# Patient Record
Sex: Male | Born: 1995 | Race: Black or African American | Hispanic: No | Marital: Single | Smoking: Former smoker
Health system: Southern US, Community
[De-identification: ages and names within clinical notes are randomized; demographics above are authoritative.]

## PROBLEM LIST (undated history)

## (undated) DIAGNOSIS — F909 Attention-deficit hyperactivity disorder, unspecified type: Principal | ICD-10-CM

## (undated) HISTORY — DX: Attention-deficit hyperactivity disorder, unspecified type: F90.9

---
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
## 2013-01-28 ENCOUNTER — Ambulatory Visit: Payer: Managed Care, Other (non HMO) | Admitting: Pediatrics

## 2014-05-25 ENCOUNTER — Other Ambulatory Visit (HOSPITAL_COMMUNITY): Payer: Self-pay | Admitting: Physician Assistant

## 2014-05-25 DIAGNOSIS — R4702 Dysphasia: Secondary | ICD-10-CM

## 2014-05-25 DIAGNOSIS — R202 Paresthesia of skin: Secondary | ICD-10-CM

## 2014-05-31 ENCOUNTER — Ambulatory Visit (HOSPITAL_COMMUNITY)
Admission: RE | Admit: 2014-05-31 | Discharge: 2014-05-31 | Disposition: A | Discharge status: Home / Self Care | Payer: Managed Care, Other (non HMO) | Source: Ambulatory Visit | Attending: Physician Assistant | Admitting: Physician Assistant

## 2014-05-31 ENCOUNTER — Other Ambulatory Visit (HOSPITAL_COMMUNITY): Payer: Managed Care, Other (non HMO)

## 2014-05-31 DIAGNOSIS — R4702 Dysphasia: Secondary | ICD-10-CM | POA: Insufficient documentation

## 2014-05-31 DIAGNOSIS — R202 Paresthesia of skin: Secondary | ICD-10-CM | POA: Insufficient documentation

## 2014-07-03 ENCOUNTER — Ambulatory Visit: Payer: Managed Care, Other (non HMO) | Admitting: Neurology

## 2014-07-05 ENCOUNTER — Ambulatory Visit (INDEPENDENT_AMBULATORY_CARE_PROVIDER_SITE_OTHER): Payer: Managed Care, Other (non HMO) | Admitting: Neurology

## 2014-07-05 ENCOUNTER — Encounter: Payer: Self-pay | Admitting: Neurology

## 2014-07-05 VITALS — BP 110/80 | HR 81 | Resp 16 | Ht 70.0 in | Wt 132.0 lb

## 2014-07-05 DIAGNOSIS — R202 Paresthesia of skin: Secondary | ICD-10-CM

## 2014-07-05 DIAGNOSIS — G0489 Other myelitis: Secondary | ICD-10-CM

## 2014-07-05 DIAGNOSIS — G373 Acute transverse myelitis in demyelinating disease of central nervous system: Secondary | ICD-10-CM

## 2014-07-05 LAB — CBC
HCT: 47.6 % (ref 39.0–52.0)
Hemoglobin: 15.7 g/dL (ref 13.0–17.0)
MCH: 29.2 pg (ref 26.0–34.0)
MCHC: 33 g/dL (ref 30.0–36.0)
MCV: 88.5 fL (ref 78.0–100.0)
MPV: 9.9 fL (ref 8.6–12.4)
Platelets: 298 10*3/uL (ref 150–400)
RBC: 5.38 MIL/uL (ref 4.22–5.81)
RDW: 14 % (ref 11.5–15.5)
WBC: 4.8 10*3/uL (ref 4.0–10.5)

## 2014-07-05 LAB — COMPREHENSIVE METABOLIC PANEL
ALBUMIN: 4.4 g/dL (ref 3.5–5.2)
ALT: 14 U/L (ref 0–53)
AST: 15 U/L (ref 0–37)
Alkaline Phosphatase: 65 U/L (ref 39–117)
BUN: 13 mg/dL (ref 6–23)
CO2: 28 mEq/L (ref 19–32)
Calcium: 9.7 mg/dL (ref 8.4–10.5)
Chloride: 103 mEq/L (ref 96–112)
Creat: 0.93 mg/dL (ref 0.50–1.35)
Glucose, Bld: 64 mg/dL — ABNORMAL LOW (ref 70–99)
Potassium: 4.2 mEq/L (ref 3.5–5.3)
Sodium: 139 mEq/L (ref 135–145)
Total Bilirubin: 0.4 mg/dL (ref 0.2–1.2)
Total Protein: 7.2 g/dL (ref 6.0–8.3)

## 2014-07-05 LAB — TSH: TSH: 1.911 u[IU]/mL (ref 0.350–4.500)

## 2014-07-05 LAB — VITAMIN B12: VITAMIN B 12: 799 pg/mL (ref 211–911)

## 2014-07-05 MED ORDER — NORTRIPTYLINE HCL 10 MG PO CAPS: ORAL_CAPSULE | ORAL | Status: AC

## 2014-07-05 NOTE — Patient Instructions (Addendum)
1. Bloodwork for CBC, CMP, ESR, ANA, SS-A, SS-B,TSH, vitamin B12, SPEP/IFE, HIV, RPR 2. MRI cervical spine with and without contrast 3. MRI thoracic spine with and without contrast 4. EMG/NCV of both UE 5. Start nortriptyline 10mg : Take 1 cap at bedtime for 1 week, then increase to 2 caps at bedtime. Monitor for drowsiness 6. Follow-up in 1 month   PLEASE CALL [facility] TO [place] @ [phone].

## 2014-07-05 NOTE — Progress Notes (Signed)
NEUROLOGY CONSULTATION NOTE  Danny Walter MRN: [id number] DOB: 1996-03-09  Referring provider: Lenise HeraldBenjamin Mann, PA-C Primary care provider: Lenise HeraldBenjamin Mann, PA-C  Reason for consult:  Tingling from waist up x 3 years  Thank you for your kind referral of Danny Walter for consultation of the above symptoms. Although his history is well known to you, please allow me to reiterate it for the purpose of our medical record. Records and images were personally reviewed where available.  HISTORY OF PRESENT ILLNESS: This is a 19 year old right-handed man with a history of ADHD presenting with a 3-year history of paresthesias in his upper body. Symptoms were initially not as painful, however over the past few months, he has been significantly bothered by pins and needles and burning pain from the waist up to his arms. It is mostly painful in the chest area, but he also notices symptoms on his back and on the top of his head, including his face. He reports that symptoms last for 5-10 minutes, often affecting his whole upper body, occurring several times a day. He has noticed that heat and lifting things would trigger the symptoms, making it hard to do anything physical. Putting on a cool towel seems to make it better. He can wake up in the middle of the night and have the symptoms. He denies any rash, but has noticed redness after he continuously rubs the affected areas. Over the past 1-2 months, he has also noticed similar symptoms over both thighs, sometimes he has slight spasms in both legs. Interestingly, alcohol (hard liquor) reduces the symptoms for a few hours, that he would take one shot of liquor daily to help.  He has mild headaches around once a week. He denies any nausea, vomiting, photo/phonophobia. He denies any dizziness, diplopia, blurred vision, dysarthria, dysphagia, focal weakness. He has occasional neck and low back pain. In the past 3-4 months, he has had an electrical shock sensation in the  back of his neck occurring around once a month, these quieted down in the past 1-2 months. He denies any bowel/bladder dysfunction. He denies any family history of similar symptoms, no falls or head injuries. He previously did heavy lifting at a prior job 6 months ago. He was prescribed low dose gabapentin which he took for a few weeks with no effect. He had seen an allergist and had taken hydroxyzine and used a steroid cream, with no effect.   I personally reviewed MRI brain without contrast which was unremarkable.  PAST MEDICAL HISTORY: Past Medical History  Diagnosis Date  . ADHD (attention deficit hyperactivity disorder) 08/26/2012    PAST SURGICAL HISTORY: No past surgical history on file.  MEDICATIONS: No current outpatient prescriptions on file prior to visit.   No current facility-administered medications on file prior to visit.    ALLERGIES: No Known Allergies  FAMILY HISTORY: Family History  Problem Relation Age of Onset  . Hyperlipidemia Father 45    died of cardiac event  . Hyperlipidemia Paternal Grandfather   . Alcohol abuse Mother     SOCIAL HISTORY: History   Social History  . Marital Status: Single    Spouse Name: N/A  . Number of Children: N/A  . Years of Education: N/A   Occupational History  . Not on file.   Social History Main Topics  . Smoking status: Never Smoker   . Smokeless tobacco: Not on file  . Alcohol Use: 0.0 oz/week    0 Standard drinks or equivalent per week  Comment: 1 shot daily  . Drug Use: No  . Sexual Activity: Not on file   Other Topics Concern  . Not on file   Social History Narrative    REVIEW OF SYSTEMS: Constitutional: No fevers, chills, or sweats, no generalized fatigue, change in appetite Eyes: No visual changes, double vision, eye pain Ear, nose and throat: No hearing loss, ear pain, nasal congestion, sore throat Cardiovascular: No chest pain, palpitations Respiratory:  No shortness of breath at rest or  with exertion, wheezes GastrointestinaI: No nausea, vomiting, diarrhea, abdominal pain, fecal incontinence Genitourinary:  No dysuria, urinary retention or frequency Musculoskeletal:  No neck pain, back pain Integumentary: No rash, pruritus, skin lesions Neurological: as above Psychiatric: No depression, insomnia, anxiety Endocrine: No palpitations, fatigue, diaphoresis, mood swings, change in appetite, change in weight, increased thirst Hematologic/Lymphatic:  No anemia, purpura, petechiae. Allergic/Immunologic: no itchy/runny eyes, nasal congestion, recent allergic reactions, rashes  PHYSICAL EXAM: Filed Vitals:   07/05/14 1313  BP: 110/80  Pulse: 81  Resp: 16   General: No acute distress Head:  Normocephalic/atraumatic Eyes: Fundoscopic exam shows bilateral sharp discs, no vessel changes, exudates, or hemorrhages Neck: supple, no paraspinal tenderness, full range of motion Back: No paraspinal tenderness Heart: regular rate and rhythm Lungs: Clear to auscultation bilaterally. Vascular: No carotid bruits. Skin/Extremities: No rash, no edema Neurological Exam: Mental status: alert and oriented to person, place, and time, no dysarthria or aphasia, Fund of knowledge is appropriate.  Recent and remote memory are intact.  Attention and concentration are normal.    Able to name objects and repeat phrases. Cranial nerves: CN I: not tested CN II: pupils equal, round and reactive to light, visual fields intact, fundi unremarkable. CN III, IV, VI:  full range of motion, no nystagmus, no ptosis CN V: facial sensation intact CN VII: upper and lower face symmetric CN VIII: hearing intact to finger rub CN IX, X: gag intact, uvula midline CN XI: sternocleidomastoid and trapezius muscles intact CN XII: tongue midline Bulk & Tone: normal, no fasciculations. Motor: 5/5 throughout with no pronator drift. Sensation: intact to light touch, cold, pin, vibration and joint position sense on both UE  and LE. Over the chest/trunk region, there is a suspended sensory level with decreased pin until the T6/T7 region on both chest and back.  No extinction to double simultaneous stimulation.  Romberg test negative Deep Tendon Reflexes: +2 throughout, negative Hoffman sign, no ankle clonus Plantar responses: downgoing bilaterally Cerebellar: no incoordination on finger to nose, heel to shin. No dysdiadochokinesia Gait: narrow-based and steady, able to tandem walk adequately. Tremor: none  IMPRESSION: This is a 19 year old right-handed man presenting with a 3-year history of worsening paresthesias over the upper body, now occurring on a daily basis with burning, pins and needles sensation over the chest, arms, and face. The etiology of his symptoms is unclear. On exam, he reports a suspended sensory level up to T6/T7. MRI brain unremarkable. An MRI cervical and thoracic spine with and without contrast will be ordered to assess for intramedullary spinal lesion. He describes neuropathic pain in the upper chest, neuropathy labs will be ordered, as well as an EMG/NCV of the upper extremities to further evaluate his symptoms. We discussed symptomatic treatment with tricyclics, he will start nortriptyline  qhs with uptitration as tolerated. Side effects were discussed. He will follow-up in 1 month.   Thank you for allowing me to participate in the care of this patient. Please do not hesitate to call for  any questions or concerns.   Danny Dolly, M.D.

## 2014-07-06 LAB — SJOGRENS SYNDROME-B EXTRACTABLE NUCLEAR ANTIBODY: SSB (La) (ENA) Antibody, IgG: <1

## 2014-07-06 LAB — RPR

## 2014-07-06 LAB — ANA: Anti Nuclear Antibody(ANA): NEGATIVE

## 2014-07-06 LAB — SJOGRENS SYNDROME-A EXTRACTABLE NUCLEAR ANTIBODY: SSA (Ro) (ENA) Antibody, IgG: <1

## 2014-07-06 LAB — HIV ANTIBODY (ROUTINE TESTING W REFLEX): HIV: NONREACTIVE

## 2014-07-06 LAB — SEDIMENTATION RATE: Sed Rate: 1 mm/hr (ref 0–15)

## 2014-07-07 LAB — PROTEIN ELECTROPHORESIS, SERUM
Albumin ELP: 58.6 % (ref 55.8–66.1)
Alpha-1-Globulin: 4.1 % (ref 2.9–4.9)
Alpha-2-Globulin: 9.5 % (ref 7.1–11.8)
Beta 2: 5.4 % (ref 3.2–6.5)
Beta Globulin: 6.1 % (ref 4.7–7.2)
Gamma Globulin: 16.3 % (ref 11.1–18.8)
Total Protein, Serum Electrophoresis: 7.2 g/dL (ref 6.0–8.3)

## 2014-07-07 LAB — IMMUNOFIXATION ELECTROPHORESIS
IgA: 202 mg/dL (ref 68–379)
IgG (Immunoglobin G), Serum: 1390 mg/dL (ref 650–1600)
IgM, Serum: 80 mg/dL (ref 41–251)
TOTAL PROTEIN, SERUM ELECTROPHOR: 7.2 g/dL (ref 6.0–8.3)

## 2014-07-07 NOTE — Progress Notes (Signed)
Done

## 2014-07-24 ENCOUNTER — Telehealth: Payer: Self-pay | Admitting: Neurology

## 2014-07-24 NOTE — Telephone Encounter (Signed)
This is still a low dose, would increase dose to 3 caps at bedtime for a week, then 4 caps at bedtime. Highly recommend proceeding with MRIs

## 2014-07-24 NOTE — Telephone Encounter (Signed)
He states that the nortriptyline isn't helping he is taking 2 a at night. Also states that he now notices that he is getting the numbness & tingling in his lower body.

## 2014-07-24 NOTE — Telephone Encounter (Signed)
Pt needs to talk to someone about MRI that needs to be order and Medication dosage please call [phone]

## 2014-07-24 NOTE — Telephone Encounter (Signed)
Called patient back. Advised of increase & directions. He is going to call [facility] to schedule his MRI's. Asked him to call us back if he needs refill on medication since directions have changed.

## 2014-08-17 ENCOUNTER — Encounter: Payer: Managed Care, Other (non HMO) | Admitting: Neurology

## 2014-08-17 DIAGNOSIS — Z029 Encounter for administrative examinations, unspecified: Secondary | ICD-10-CM

## 2014-08-21 ENCOUNTER — Encounter: Payer: Self-pay | Admitting: Neurology

## 2014-10-30 ENCOUNTER — Telehealth: Payer: Self-pay | Admitting: Neurology

## 2014-10-30 NOTE — Telephone Encounter (Signed)
Pls f/u with patient if he is still interested in pursuing imaging. If not, ok to close. Thanks

## 2014-10-30 NOTE — Telephone Encounter (Signed)
Please call patient regarding this and let me know. Thanks, [employer]

## 2014-10-30 NOTE — Telephone Encounter (Signed)
Pt was to call [facility] to set up MRI's ordered earlier this year. To date, it does not appear that he has done so. Do we need to do anything further or can referral for MRI's on this patient be closed? Please advise - Sherri S.

## 2014-11-01 NOTE — Telephone Encounter (Signed)
Called patient, no answer. Voicemail is not set up, will try again later.

## 2014-11-01 NOTE — Telephone Encounter (Signed)
Patient called back. He states that he isn't going to get MRI scan done due to finances.

## 2015-06-16 ENCOUNTER — Emergency Department (HOSPITAL_COMMUNITY)
Admission: EM | Admit: 2015-06-16 | Discharge: 2015-06-16 | Disposition: A | Discharge status: Home / Self Care | Payer: 59 | Attending: Emergency Medicine | Admitting: Emergency Medicine

## 2015-06-16 ENCOUNTER — Emergency Department (HOSPITAL_COMMUNITY): Payer: 59

## 2015-06-16 ENCOUNTER — Encounter (HOSPITAL_COMMUNITY): Payer: Self-pay | Admitting: Emergency Medicine

## 2015-06-16 DIAGNOSIS — Y9389 Activity, other specified: Secondary | ICD-10-CM | POA: Diagnosis not present

## 2015-06-16 DIAGNOSIS — Z8659 Personal history of other mental and behavioral disorders: Secondary | ICD-10-CM | POA: Insufficient documentation

## 2015-06-16 DIAGNOSIS — Y998 Other external cause status: Secondary | ICD-10-CM | POA: Insufficient documentation

## 2015-06-16 DIAGNOSIS — Y9289 Other specified places as the place of occurrence of the external cause: Secondary | ICD-10-CM | POA: Diagnosis not present

## 2015-06-16 DIAGNOSIS — S4992XA Unspecified injury of left shoulder and upper arm, initial encounter: Secondary | ICD-10-CM | POA: Insufficient documentation

## 2015-06-16 DIAGNOSIS — S29002A Unspecified injury of muscle and tendon of back wall of thorax, initial encounter: Secondary | ICD-10-CM | POA: Insufficient documentation

## 2015-06-16 DIAGNOSIS — W19XXXA Unspecified fall, initial encounter: Secondary | ICD-10-CM

## 2015-06-16 DIAGNOSIS — Z79899 Other long term (current) drug therapy: Secondary | ICD-10-CM | POA: Diagnosis not present

## 2015-06-16 MED ORDER — IBUPROFEN 800 MG PO TABS: 800.0000 mg | ORAL_TABLET | Freq: Three times a day (TID) | ORAL | Status: AC | PRN

## 2015-06-16 MED ORDER — IOHEXOL 300 MG/ML  SOLN
75.0000 mL | Freq: Once | INTRAMUSCULAR | Status: AC | PRN
  Administered 2015-06-16: 75 mL via INTRAVENOUS
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
## 2015-06-16 NOTE — ED Notes (Signed)
I stat chem 8 will not cross over, hard copy printed for EDP. Chem 8 WNL.

## 2015-06-16 NOTE — Discharge Instructions (Signed)
Follow up with your family md if not improving. °

## 2015-06-16 NOTE — ED Notes (Signed)
Pt states understanding of care given and follow up instructions.  Ambulated from ED  

## 2015-06-16 NOTE — ED Notes (Signed)
MD at bedside. 
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
## 2015-06-18 LAB — I-STAT CHEM 8, ED
BUN: 18 mg/dL (ref 6–20)
CREATININE: 1 mg/dL (ref 0.61–1.24)
Calcium, Ion: 1.22 mmol/L (ref 1.12–1.23)
Chloride: 102 mmol/L (ref 101–111)
Glucose, Bld: 84 mg/dL (ref 65–99)
HCT: 50 % (ref 39.0–52.0)
HEMOGLOBIN: 17 g/dL (ref 13.0–17.0)
POTASSIUM: 3.5 mmol/L (ref 3.5–5.1)
Sodium: 143 mmol/L (ref 135–145)
TCO2: 28 mmol/L (ref 0–100)

## 2016-04-06 ENCOUNTER — Emergency Department (HOSPITAL_COMMUNITY)
Admission: EM | Admit: 2016-04-06 | Discharge: 2016-04-06 | Disposition: A | Discharge status: Home / Self Care | Payer: 59 | Attending: Emergency Medicine | Admitting: Emergency Medicine

## 2016-04-06 ENCOUNTER — Encounter (HOSPITAL_COMMUNITY): Payer: Self-pay | Admitting: Emergency Medicine

## 2016-04-06 DIAGNOSIS — Y929 Unspecified place or not applicable: Secondary | ICD-10-CM | POA: Insufficient documentation

## 2016-04-06 DIAGNOSIS — F909 Attention-deficit hyperactivity disorder, unspecified type: Secondary | ICD-10-CM | POA: Diagnosis not present

## 2016-04-06 DIAGNOSIS — S61213A Laceration without foreign body of left middle finger without damage to nail, initial encounter: Secondary | ICD-10-CM | POA: Insufficient documentation

## 2016-04-06 DIAGNOSIS — Z79899 Other long term (current) drug therapy: Secondary | ICD-10-CM | POA: Diagnosis not present

## 2016-04-06 DIAGNOSIS — Z23 Encounter for immunization: Secondary | ICD-10-CM | POA: Insufficient documentation

## 2016-04-06 DIAGNOSIS — W268XXA Contact with other sharp object(s), not elsewhere classified, initial encounter: Secondary | ICD-10-CM | POA: Diagnosis not present

## 2016-04-06 DIAGNOSIS — F17210 Nicotine dependence, cigarettes, uncomplicated: Secondary | ICD-10-CM | POA: Diagnosis not present

## 2016-04-06 DIAGNOSIS — Y999 Unspecified external cause status: Secondary | ICD-10-CM | POA: Diagnosis not present

## 2016-04-06 DIAGNOSIS — Y9389 Activity, other specified: Secondary | ICD-10-CM | POA: Insufficient documentation

## 2016-04-06 MED ORDER — TETANUS-DIPHTH-ACELL PERTUSSIS 5-2.5-18.5 LF-MCG/0.5 IM SUSP
0.5000 mL | Freq: Once | INTRAMUSCULAR | Status: AC
  Administered 2016-04-06: 0.5 mL via INTRAMUSCULAR
  Filled 2016-04-06: qty 0.5

## 2016-04-06 NOTE — Discharge Instructions (Signed)
Clean the wound with mild soap and water.  Keep it bandaged while working.  Return for any signs of infection such as redness, swelling, drainage or red streaks.

## 2016-04-06 NOTE — ED Triage Notes (Signed)
Pt reports laceration to his L middle finger. Pt stuck his hand in the intake on a super charger. Lac at end of finger.

## 2016-04-06 NOTE — ED Provider Notes (Signed)
[facility]-EMERGENCY DEPT Provider Note   CSN: [id number] Arrival date & time: 04/06/16  1531  By signing my name below, I, Soijett Blue, attest that this documentation has been prepared under the direction and in the presence of Pauline Ausammy Jezreel Sisk, PA-C Electronically Signed: Soijett Blue, ED Scribe. 04/06/16. 4:23 PM.  History   Chief Complaint Chief Complaint  Patient presents with  . Laceration    HPI Danny DuelJeremy Bittel is a 20 y.o. male who presents to the Emergency Department complaining of left middle finger laceration onset 1 hour ago. He notes that he was working on a car when his left middle finger became caught in the intake of a super charger that he was working on causing a cut to his left middle finger. Pt reports that he is not UTD with his tetanus vaccination at this time. He states that he is having associated symptoms of left middle finger pain. He states that he has tried applying pressure without medications for the relief of his symptoms. He denies color change, joint swelling, and difficulty moving the finger  The history is provided by the patient. No language interpreter was used.  Laceration   The incident occurred less than 1 hour ago. Pain location: left middle finger. The laceration mechanism was a a metal edge (super Consulting civil engineercharger on car). The pain is mild. The pain has been constant since onset. He reports no foreign bodies present. His tetanus status is out of date.    Past Medical History:  Diagnosis Date  . ADHD (attention deficit hyperactivity disorder) 08/26/2012    Patient Active Problem List   Diagnosis Date Noted  . ADHD (attention deficit hyperactivity disorder) 08/26/2012    History reviewed. No pertinent surgical history.     Home Medications    Prior to Admission medications   Medication Sig Start Date End Date Taking? Authorizing Provider  amitriptyline (ELAVIL) 25 MG tablet Take 25 mg by mouth daily.  06/06/15   Historical Provider, MD  ibuprofen  (ADVIL,MOTRIN) 800 MG tablet Take 1 tablet (800 mg total) by mouth every 8 (eight) hours as needed for moderate pain. 06/16/15   Bethann BerkshireJoseph Zammit, MD  nortriptyline (PAMELOR) 10 MG capsule Take 1 capsule at bedtime for 1 week, then increase to 2 caps at bedtime 07/05/14   Van ClinesKaren M Aquino, MD    Family History Family History  Problem Relation Age of Onset  . Hyperlipidemia Father 45    died of cardiac event  . Hyperlipidemia Paternal Grandfather   . Alcohol abuse Mother     Social History Social History  Substance Use Topics  . Smoking status: Current Every Day Smoker    Packs/day: 0.25    Types: Cigarettes  . Smokeless tobacco: Never Used  . Alcohol use 4.2 oz/week    7 Cans of beer per week     Allergies   Patient has no known allergies.   Review of Systems Review of Systems  Musculoskeletal: Positive for arthralgias (left middle finger). Negative for joint swelling.  Skin: Positive for wound (laceration to left middle finger). Negative for color change.     Physical Exam Updated Vital Signs BP 138/79 (BP Location: Right Arm)   Pulse 67   Temp 98.3 F (36.8 C) (Oral)   Resp 16   Ht 5\' 9"  (1.753 m)   Wt 160 lb (72.6 kg)   SpO2 99%   BMI 23.63 kg/m   Physical Exam  Constitutional: He is oriented to person, place, and time. He appears well-developed  and well-nourished. No distress.  HENT:  Head: Normocephalic and atraumatic.  Eyes: EOM are normal.  Neck: Neck supple.  Cardiovascular: Normal rate.   Pulmonary/Chest: Effort normal. No respiratory distress.  Abdominal: He exhibits no distension.  Musculoskeletal: Normal range of motion.       Left hand: Normal sensation noted. Normal strength noted.  Superficial skin avulsion, tuft of the left middle finger. Bleeding controlled. Nail intact, No subungual hematoma. Sensation and motor function of finger intact.   Neurological: He is alert and oriented to person, place, and time.  Skin: Skin is warm and dry.    Psychiatric: He has a normal mood and affect. His behavior is normal.  Nursing note and vitals reviewed.    ED Treatments / Results  DIAGNOSTIC STUDIES: Oxygen Saturation is 99% on RA, nl by my interpretation.    COORDINATION OF CARE: 4:22 PM Discussed treatment plan with pt at bedside which includes wound care and update tetanus vaccination and pt agreed to plan.   Procedures Procedures (including critical care time)  Medications Ordered in ED Medications - No data to display   Initial Impression / Assessment and Plan / ED Course  I have reviewed the triage vital signs and the nursing notes.   Clinical Course     superficial skin avulsion of the tuft of the finger.  No indication for suture repair.  Bleeding controlled.  NV intact.  Wound cleaned, dressed with xeroform gauze.  Td updated.  Pt given wound care instructions,  Agrees to ibuprofen if needed for pain and ER return precautions given.  Final Clinical Impressions(s) / ED Diagnoses   Final diagnoses:  Laceration of left middle finger without foreign body without damage to nail, initial encounter    New Prescriptions New Prescriptions   No medications on file   I personally performed the services described in this documentation, which was scribed in my presence. The recorded information has been reviewed and is accurate.     Pauline Ausammy Tyneka Scafidi, PA-C 04/07/16 2121    Azalia BilisKevin Campos, MD 04/08/16 [phone]
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
## 2021-10-16 ENCOUNTER — Ambulatory Visit
Admission: EM | Admit: 2021-10-16 | Discharge: 2021-10-16 | Disposition: A | Discharge status: Home / Self Care | Payer: Managed Care, Other (non HMO) | Attending: Nurse Practitioner | Admitting: Nurse Practitioner

## 2021-10-16 ENCOUNTER — Other Ambulatory Visit: Payer: Self-pay

## 2021-10-16 DIAGNOSIS — Z8249 Family history of ischemic heart disease and other diseases of the circulatory system: Secondary | ICD-10-CM

## 2021-10-16 DIAGNOSIS — R030 Elevated blood-pressure reading, without diagnosis of hypertension: Secondary | ICD-10-CM

## 2021-10-16 DIAGNOSIS — R519 Headache, unspecified: Secondary | ICD-10-CM

## 2021-10-16 MED ORDER — AMLODIPINE BESYLATE 5 MG PO TABS: 5.0000 mg | ORAL_TABLET | Freq: Every day | ORAL | 0 refills | Status: AC

## 2021-10-16 NOTE — ED Provider Notes (Signed)
[facility]    CSN: [id number] Arrival date & time: 10/16/21  0858      History   Chief Complaint Chief Complaint  Patient presents with   Shortness of Breath   Headache   Hypertension    HPI Danny Walter is a 26 y.o. male.   Patient presents with concerns of elevated blood pressure.  He reports he has not been able to check his blood pressure at home, however has noticed over the past couple months, he has been having more headaches and sensation in his chest that he is unable to describe after eating foods high in sodium like pepperoni, sausage, and ham.  He denies shortness of breath, chest pain, dizziness or lightheadedness, lower extremity swelling.    He reports a family history significant for coronary artery disease and early heart attacks; his father died at age 63 of a heart attack and paternal grandfather died around age 27 of the same.  Patient reports he drinks mostly soda throughout the day, he also usually goes to the drive-through for breakfast.  He also admits to regular alcohol use.   Past Medical History:  Diagnosis Date   ADHD (attention deficit hyperactivity disorder) 08/26/2012    Patient Active Problem List   Diagnosis Date Noted   ADHD (attention deficit hyperactivity disorder) 08/26/2012    History reviewed. No pertinent surgical history.     Home Medications    Prior to Admission medications   Medication Sig Start Date End Date Taking? Authorizing Provider  amLODipine (NORVASC) 5 MG tablet Take 1 tablet (5 mg total) by mouth daily. 10/16/21  Yes Valentino Nose, NP  amitriptyline (ELAVIL) 25 MG tablet Take 25 mg by mouth daily.  06/06/15   [provider]  ibuprofen (ADVIL,MOTRIN) 800 MG tablet Take 1 tablet (800 mg total) by mouth every 8 (eight) hours as needed for moderate pain. 06/16/15   Bethann Berkshire, MD  nortriptyline (PAMELOR) 10 MG capsule Take 1 capsule at bedtime for 1 week, then increase to 2 caps at bedtime  07/05/14   Van Clines, MD    Family History Family History  Problem Relation Age of Onset   Hyperlipidemia Father 6       died of cardiac event   Hyperlipidemia Paternal Grandfather    Alcohol abuse Mother     Social History Social History   Tobacco Use   Smoking status: Former    Packs/day: 0.25    Types: Cigarettes   Smokeless tobacco: Never  Substance Use Topics   Alcohol use: Yes    Alcohol/week: 21.0 standard drinks    Types: 21 Cans of beer per week   Drug use: No     Allergies   Patient has no known allergies.   Review of Systems Review of Systems Per HPI  Physical Exam Triage Vital Signs ED Triage Vitals  Enc Vitals Group     BP 10/16/21 0925 (!) 145/87     Pulse Rate 10/16/21 0925 70     Resp 10/16/21 0925 16     Temp 10/16/21 0925 98.2 F (36.8 C)     Temp Source 10/16/21 0925 Oral     SpO2 10/16/21 0925 98 %     Weight --      Height --      Head Circumference --      Peak Flow --      Pain Score 10/16/21 0923 4     Pain Loc --  Pain Edu? --      Excl. in [place]? --    No data found.  Updated Vital Signs BP (!) 145/87 (BP Location: Left Arm)   Pulse 70   Temp 98.2 F (36.8 C) (Oral)   Resp 16   SpO2 98%   Visual Acuity Right Eye Distance:   Left Eye Distance:   Bilateral Distance:    Right Eye Near:   Left Eye Near:    Bilateral Near:     Physical Exam Vitals and nursing note reviewed.  Constitutional:      General: He is not in acute distress.    Appearance: Normal appearance. He is not toxic-appearing.  Eyes:     General: No scleral icterus.    Extraocular Movements: Extraocular movements intact.  Cardiovascular:     Rate and Rhythm: Normal rate and regular rhythm.  Pulmonary:     Effort: Pulmonary effort is normal. No respiratory distress.     Breath sounds: Normal breath sounds. No wheezing, rhonchi or rales.  Musculoskeletal:     Right lower leg: No edema.     Left lower leg: No edema.  Skin:    General:  Skin is warm and dry.     Capillary Refill: Capillary refill takes less than 2 seconds.     Coloration: Skin is not jaundiced or pale.     Findings: No erythema.  Neurological:     Mental Status: He is alert and oriented to person, place, and time.     Motor: No weakness.     Gait: Gait normal.  Psychiatric:        Behavior: Behavior is cooperative.     UC Treatments / Results  Labs (all labs ordered are listed, but only abnormal results are displayed) Labs Reviewed - No data to display  EKG   Radiology No results found.  Procedures Procedures (including critical care time)  Medications Ordered in UC Medications - No data to display  Initial Impression / Assessment and Plan / UC Course  I have reviewed the triage vital signs and the nursing notes.  Pertinent labs & imaging results that were available during my care of the patient were reviewed by me and considered in my medical decision making (see chart for details).    EKG today is unremarkable.  However, blood pressure is slightly elevated.  Unfortunately, this is the only blood pressure we have documented for the past 5 years for this patient.  I encouraged the patient to check his blood pressure a few times per week at home.  If the blood pressure remains elevated above 130/80, I have given him a prescription for amlodipine 5 mg to start taking daily.  We also discussed dietary lifestyle changes at length including limiting eating out, lowering salt in his diet, increasing water intake, and limiting alcohol.  Patient declines blood work today.  We will initiate primary care assistance to help get him scheduled a primary care provider.  Discussed ER precautions at length. Final Clinical Impressions(s) / UC Diagnoses   Final diagnoses:  Elevated blood pressure reading  Intractable headache, unspecified chronicity pattern, unspecified headache type  Family history of early CAD     Discharge Instructions      - Your  blood pressure is elevated today; please start checking your blood pressure with an automatic blood pressure cuff when you are sitting down with your feet flat on the floor and back supported a few times per week.  If your blood  pressure remains elevated more than 130/80, please start on the amlodipine 5 mg daily.  - The EKG today looks stable -Please work on dietary lifestyle changes including limiting eating out, sodium in your diet, and sodas.  Try to drink at least 64 ounces of water daily and increase intake of fruits and vegetables.  -I have initiated primary care assistance to help get you scheduled with a primary care provider. -Please seek care if you develop a severe headache or chest pain at any time     ED Prescriptions     Medication Sig Dispense Auth. Provider   amLODipine (NORVASC) 5 MG tablet Take 1 tablet (5 mg total) by mouth daily. 30 tablet Eulogio Bear, NP      PDMP not reviewed this encounter.   Eulogio Bear, NP 10/16/21 1024

## 2021-10-16 NOTE — Discharge Instructions (Addendum)
-   Your blood pressure is elevated today; please start checking your blood pressure with an automatic blood pressure cuff when you are sitting down with your feet flat on the floor and back supported a few times per week.  If your blood pressure remains elevated more than 130/80, please start on the amlodipine 5 mg daily.  - The EKG today looks stable -Please work on dietary lifestyle changes including limiting eating out, sodium in your diet, and sodas.  Try to drink at least 64 ounces of water daily and increase intake of fruits and vegetables.  -I have initiated primary care assistance to help get you scheduled with a primary care provider. -Please seek care if you develop a severe headache or chest pain at any time

## 2021-10-16 NOTE — ED Triage Notes (Signed)
Pt though he had high blood pressure as he felt shortness of breath and headache after eating sausage and bacon yesterday. Pt never checked his blood pressure.
